# Patient Record
Sex: Female | Born: 1965 | Hispanic: No | Marital: Married | Smoking: Never smoker
Health system: Southern US, Community
[De-identification: ages and names within clinical notes are randomized; demographics above are authoritative.]

## PROBLEM LIST (undated history)

## (undated) DIAGNOSIS — G8929 Other chronic pain: Secondary | ICD-10-CM

## (undated) DIAGNOSIS — K219 Gastro-esophageal reflux disease without esophagitis: Secondary | ICD-10-CM

## (undated) HISTORY — DX: Gastro-esophageal reflux disease without esophagitis: K21.9

## (undated) HISTORY — DX: Other chronic pain: G89.29

---
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
## 1997-06-06 ENCOUNTER — Other Ambulatory Visit: Admission: RE | Admit: 1997-06-06 | Discharge: 1997-06-06 | Payer: Self-pay | Admitting: Gynecology

## 2002-06-06 ENCOUNTER — Encounter: Payer: Self-pay | Admitting: Urology

## 2002-06-06 ENCOUNTER — Observation Stay (HOSPITAL_COMMUNITY): Admission: RE | Admit: 2002-06-06 | Discharge: 2002-06-07 | Payer: Self-pay | Admitting: Obstetrics and Gynecology

## 2002-06-06 ENCOUNTER — Encounter (INDEPENDENT_AMBULATORY_CARE_PROVIDER_SITE_OTHER): Payer: Self-pay

## 2003-01-02 ENCOUNTER — Other Ambulatory Visit: Admission: RE | Admit: 2003-01-02 | Discharge: 2003-01-02 | Payer: Self-pay | Admitting: Obstetrics and Gynecology

## 2004-03-31 ENCOUNTER — Other Ambulatory Visit: Admission: RE | Admit: 2004-03-31 | Discharge: 2004-03-31 | Payer: Self-pay | Admitting: Obstetrics and Gynecology

## 2006-03-09 HISTORY — PX: ABDOMINAL HYSTERECTOMY: SHX81

## 2007-02-23 ENCOUNTER — Inpatient Hospital Stay (HOSPITAL_COMMUNITY): Admission: RE | Admit: 2007-02-23 | Discharge: 2007-02-25 | Payer: Self-pay | Admitting: Obstetrics and Gynecology

## 2007-02-23 ENCOUNTER — Encounter (INDEPENDENT_AMBULATORY_CARE_PROVIDER_SITE_OTHER): Payer: Self-pay | Admitting: Obstetrics and Gynecology

## 2007-03-18 ENCOUNTER — Encounter: Admission: RE | Admit: 2007-03-18 | Discharge: 2007-03-18 | Payer: Self-pay | Admitting: Obstetrics and Gynecology

## 2007-09-20 ENCOUNTER — Encounter: Admission: RE | Admit: 2007-09-20 | Discharge: 2007-09-20 | Payer: Self-pay | Admitting: Obstetrics and Gynecology

## 2008-03-26 ENCOUNTER — Encounter: Admission: RE | Admit: 2008-03-26 | Discharge: 2008-03-26 | Payer: Self-pay | Admitting: Obstetrics and Gynecology

## 2008-09-14 ENCOUNTER — Encounter: Admission: RE | Admit: 2008-09-14 | Discharge: 2008-09-14 | Payer: Self-pay | Admitting: Obstetrics and Gynecology

## 2009-04-05 ENCOUNTER — Encounter: Admission: RE | Admit: 2009-04-05 | Discharge: 2009-04-05 | Payer: Self-pay | Admitting: Obstetrics and Gynecology

## 2010-03-31 ENCOUNTER — Encounter: Payer: Self-pay | Admitting: Obstetrics and Gynecology

## 2010-07-22 NOTE — Discharge Summary (Signed)
NAMEJolin, [name]                  ACCOUNT NO.:  [id number]   MEDICAL RECORD NO.:  [id number]          PATIENT TYPE:  INP   LOCATION:  9307                          FACILITY:  [facility]   PHYSICIAN:  Michelle L. Grewal, M.D.DATE OF BIRTH:  [birth year]   DATE OF ADMISSION:  02/23/2007  DATE OF DISCHARGE:  02/25/2007                               DISCHARGE SUMMARY   PREOP DIAGNOSES:  1. Pelvic pain.  2. History of endometriosis.  3. Interstitial cystitis.  4. Enlarged right labia.   POSTOP DIAGNOSES:  1. Pelvic pain.  2. History of endometriosis.  3. Interstitial cystitis.  4. Enlarged right labia.   PROCEDURE:  The patient is a 45 year old G1, P1, who presents today for  definitive treatment of her endometriosis and pelvic pain.  She  underwent LAVH, BSO, fulguration of endometriosis, and right labial  reduction by myself.  Dr. Logan Bores performed cystoscopy and  hydrodistention.  EBL was 200 mL.  She did very well after surgery.  She  did feel somewhat distended on postop day #1 and felt some gas pain.  Her hemoglobin was 9.  She was discharged home in good condition on  postop day #2.  She had flatus on postop day #2.  Her abdomen was soft  and nontender.  She was given ibuprofen 600 mg every 6 hours to take as  needed for pain, Tylox one to two every 4-6 as needed for pain.  She  will follow up in the office in 2-3 weeks.  She was advised to call if  she has any heavy vaginal bleeding, fever, drainage from her vagina,  nausea, vomiting, increasing abdominal pain.  She was advised no driving  for 1-2 weeks, no sex for 6 weeks.      Michelle L. Vincente Poli, M.D.  Electronically Signed     MLG/MEDQ  D:  03/14/2007  T:  03/15/2007  Job:  [id number]

## 2010-07-22 NOTE — Op Note (Signed)
NAMEChaylee, [name]                  ACCOUNT NO.:  [id number]   MEDICAL RECORD NO.:  [id number]          PATIENT TYPE:  AMB   LOCATION:  SDC                           FACILITY:  [facility]   PHYSICIAN:  Michelle L. Grewal, M.D.DATE OF BIRTH:  09/26/1965   DATE OF PROCEDURE:  02/23/2007  DATE OF DISCHARGE:                               OPERATIVE REPORT   PREOPERATIVE DIAGNOSES:  Pelvic pain and endometriosis and enlarged  right labia.   POSTOPERATIVE DIAGNOSES:  Pelvic pain and endometriosis and enlarged  right labia.   PROCEDURE:  Laparoscopic-assisted vaginal hysterectomy, bilateral  salpingo-oophorectomy, fulguration of endometriosis and right labial  reduction.   SURGEON:  Michelle L. Vincente Poli, M.D.   ASSISTANTFreddy Finner, M.D.   ANESTHESIA:  General.   SPECIMENS:  Uterus, cervix, tubes and ovaries.   ESTIMATED BLOOD LOSS:  200 mL.   COMPLICATIONS:  None.   PROCEDURE:  Patient taken to the operating room.  She was intubated.  She was prepped and draped in usual sterile fashion.  She was initially  taken to OR by Dr. Logan Bores to perform a cystoscopy and hydrodistention,  that will be dictated by him.  After he completed the procedure I then  reprepped and draped the patient for LAVH.  The uterine manipulator was  inserted.  A Foley catheter was inserted.  Attention was turned to the  abdomen.  Infraumbilical incision was made after local was infiltrated.  The Veress needle was inserted.  Pneumoperitoneum was performed and an  11-mm trocar was inserted.  The patient was placed in Trendelenburg  position.  A 5-mm trocar was inserted suprapubically.  We then  visualized the pelvis.  The uterus appeared normal.  She had two powder  burn lesions, one on the right pelvic sidewall, one on the left pelvic  sidewall and we visualized the ureters that were peristalsing normally.  I then identified each IP ligament.  Started on the right side, burned  the IP ligament just beneath the  ovary and carried it down to the round  ligament.  This was done on the right and then on the left side in  similar fashion.  I did use Kleppingers to burn and fulgurate the  lesions on the side wall.  We then turned our attention vaginally where  then a weighted speculum was placed in the vagina.  A circumferential  incision was made around the cervix.  The posterior cul-de-sac was  entered sharply.  The anterior cul-de-sac was entered sharply.  Curved  Haney clamps were then placed to clamp the uterosacral and cardinal  ligaments on either side.  Each pedicle was cut and suture ligated using  zero Vicryl suture.  We walked our way up the broad ligament on either  side.  Each pedicle was suture ligated using zero Vicryl suture.  The  specimen was removed.  The pedicles were secured using zero Vicryl  suture.  The posterior cuff was closed from 3 to 9 o'clock in a running  locked stitch using zero Vicryl suture.  The cuff was closed completely  anterior  to posterior in a running locked stitch using zero Vicryl  suture.  Hemostasis was excellent throughout the case.  Then at this  point, she had some discomfort from an enlarged right labia.  I then  used the scalpel and made a linear incision and basically trimmed off  the redundant tissue so that the right labial was identical size as the  left labium.  We then closed that using the smallest 2-0 Vicryl running  stitch.  At this point I then changed my gloves and went back up to the  abdomen, replaced the pneumoperitoneum, irrigated the pelvis completely.  Hemostasis was excellent.  No bleeding was noted from any surgical  pedicles.  All of the pneumoperitoneum was released.  The trocars were  removed and Dermabond was placed at each incision site.  All sponge, lap  and instrument counts were correct x2.  She went to recovery room in  stable condition.      Michelle L. Vincente Poli, M.D.  Electronically Signed     MLG/MEDQ  D:   02/23/2007  T:  02/23/2007  Job:  [id number]
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
## 2010-07-22 NOTE — Op Note (Signed)
[name], [name]                  ACCOUNT NO.:  [id number]   MEDICAL RECORD NO.:  [id number]          PATIENT TYPE:  AMB   LOCATION:  SDC                           [facility]   PHYSICIAN:  Jamison Neighbor, M.D.  DATE OF BIRTH:  16-Aug-1965   DATE OF PROCEDURE:  02/22/2007  DATE OF DISCHARGE:                               OPERATIVE REPORT   PREOPERATIVE DIAGNOSES:  1. Chronic pelvic pain, rule out interstitial cystitis.  2. Chronic pelvic pain with history of endometriosis.   POSTOPERATIVE DIAGNOSES:  1. Chronic pelvic pain, rule out interstitial cystitis.  2. Chronic pelvic pain with history of endometriosis.   PROCEDURES:  1. Cystoscopy, hydrodistention of the bladder, Marcaine and Pyridium      instillation, Marcaine and Kenalog injection, Jamison Neighbor, M.D.  2. Laparoscopically-assisted vaginal hysterectomy with bilateral      salpingo-oophorectomy and labioplasty by Marcelino Duster L. Vincente Poli, M.D.   ANESTHESIA:  General.   COMPLICATIONS FROM UROLOGICAL SURGERY:  None.   DRAINS:  A Postoperative Foley catheter.   HISTORY:  This 45 year old female has chronic pelvic pain with a history  of endometriosis.  She is scheduled to undergo LAVH, BSO and  labioplasty.  The patient has concern that she might have interstitial  cystitis and has requested a cystoscopy and hydrodistention be  performed.  This is an attempt to try and perform this at the same time  as her planned gynecologic procedure.  She understands the risks and  benefits of the procedure and specifically realizes that the procedure  itself is being done for diagnostic purposes and may not given any  significant information insofar as pain control as concerned.  Full  informed consent was obtained.   PROCEDURE:  After successful induction of general anesthesia, the  patient was placed in the dorsal lithotomy position, prepped with  Betadine and draped in the usual sterile fashion.  Careful bimanual  examination  revealed no significant cystocele, rectocele or enterocele.  Vault length was fairly normal.  The urethra was palpably normal with no  signs of a diverticulum.  The cystoscope was inserted and the bladder  was carefully inspected.  No tumors or stones could be seen.  There was  a modest degree of squamous metaplasia at the base of bladder felt to be  insignificant and not a likely source for any pain.  There was nothing  that required a biopsy or fulguration.  Hydrodistention of the bladder  was performed.  The bladder was distended at a pressure of 100 cmH2O for  5 minutes.  When the bladder was drained bladder capacity was just under  1000 mL, which is almost normal with normal being 1150 and the average  capacity for an IC patient being approximately 575.  The patient had  little, if any, in the way of glomerulations.  There was nothing  requiring biopsy.  This was felt to be in essence a normal bladder.  The  bladder was drained.  A mixture of Marcaine and Pyridium was left in the  bladder.  Marcaine and Kenalog were injected as  a pudendal block.  The  patient tolerated the procedure and was taken to the recovery room in  good condition.  She had a Foley catheter placed but clamped.  It will  be used by Dr. Vincente Poli for her portion of the procedure.  This will be  dictated by Dr. Vincente Poli well separately.  The patient will be admitted to  the hospital under Dr. Lynnell Dike service.  I would be happy to see her in  follow-up in approximately 2 weeks.  At that point we will review these  findings with the patient and determine if she had any change in her  symptoms.  If the patient had significant improvement in her voiding  symptoms simply from the hydrodistention, then I would consider that  indicative of interstitial cystitis and might add consider treatment  with instillation therapy or an [place]-based protocol.  On the other  hand, if there was no change in symptoms and she simply had had  the same  urgency and frequency, we would do empiric therapy with  anticholinergics, InterStim or Botox injections.  Decision as to the  further management of her urinary symptoms will be based on her next  follow-up visit.      Jamison Neighbor, M.D.  Electronically Signed     RJE/MEDQ  D:  02/23/2007  T:  02/23/2007  Job:  [id number]   cc:   Marcelino Duster L. Vincente Poli, M.D.  Fax: [phone]

## 2010-07-25 NOTE — Op Note (Signed)
NAMEKRISTAIN, [name]                            ACCOUNT NO.:  [id number]   MEDICAL RECORD NO.:  [id number]                   PATIENT TYPE:  AMB   LOCATION:  DAY                                  FACILITY:  [facility]   PHYSICIAN:  Jamison Neighbor, M.D.               DATE OF BIRTH:  Nov 18, 1965   DATE OF PROCEDURE:  06/06/2002  DATE OF DISCHARGE:                                 OPERATIVE REPORT   PREOPERATIVE DIAGNOSIS:  1. Microscopic hematuria.  2. Persistent lower urinary tract symptomatology consistent with possible     interstitial cystitis.  3. History of endometriosis.   POSTOPERATIVE DIAGNOSIS:  1. Microscopic hematuria.  2. Persistent lower urinary tract symptomatology consistent with possible     interstitial cystitis.  3. History of endometriosis.   OPERATION PERFORMED:  Cystoscopy, urethral calibration, bilateral  retrogrades including interpretation, hydrodistention of the bladder,  bladder biopsy, Marcaine Pyridium instillation, Marcaine and Kenalog  injection.   SURGEON:  Jamison Neighbor, M.D.   ANESTHESIA:  General.   COMPLICATIONS:  None.   DRAINS:  None.   INDICATIONS FOR PROCEDURE:  This 45 year old female has had problems with  lower urinary tract symptoms including urgency, frequency and chronic pelvic  pain.  The patient is scheduled to undergo evaluation by Dr. Vincente Poli which  will include laparoscopy.  Dr. Vincente Poli felt that the patient had all the  signs and symptoms of interstitial cystitis based on her evaluation.  The  patient was also evaluated in my office and we felt that not only her exam  but her symptom score was consistent with an interstitial cystitis  diagnosis.  The patient has requested a hydrodistention be done at the time  of the laparoscopy.  Because the patient also has a history of microscopic  hematuria, she will undergo a retrograde study simultaneously.  Full and  informed consent was obtained.   DESCRIPTION OF PROCEDURE:  After  successful induction of general anesthesia,  the patient was placed and positioned for laparoscopy.  She underwent  laparoscopic evaluation by Dr. Vincente Poli.  A small powder burn lesion was seen  on the bladder flap but there was not widespread endometriosis as source for  the pelvic pain.  This area was fulgurated.  The patient was then prepared  for cystoscopy.  Careful bimanual examination revealed no significant  abnormalities, specifically, no masses were detected.  Urethra was  unremarkable.  There was no evidence of cysto, recto or enterocele.  The  uterus was palpably normal.  The urethra was calibrated at 65 [place] with  female urethral sounds with no signs of stenosis or stricture.  The  cystoscope was inserted.  The bladder was carefully inspected.  It was free  of any tumors or stones.  Both ureteral orifices were normal in  configuration and location.  Clear urine seemed to efflux from each.  Retrograde studies performed on each side  showed no upper tract  abnormalities and the drain out film showed no signs of obstruction.  Hydrodistention was then performed.  The bladder was distended at a pressure  of 100 cm of H2O for five minutes, then the bladder was drained.  A moderate  sum of glomerulations could be seen.  The bladder capacity was slightly  diminished at 800 compared to a normal capacity of 1150 and average IC  capacity of 575. Additional inspection was performed and there was no  evidence of endometrial implants anywhere in the bladder or any other  significant abnormality.  The bladder was drained.  A bladder biopsy was  performed.  The biopsy site was cauterized.  This will be sent for mast cell  analysis as well as to rule out carcinoma in situ.  A mixture of Marcaine  and Pyridium was instilled in the bladder.  Marcaine and Kenalog were  injected as a pudendal block.  The patient tolerated the procedure well and  was taken to recovery in good condition.                                                Jamison Neighbor, M.D.    RJE/MEDQ  D:  06/06/2002  T:  06/06/2002  Job:  [id number]   cc:   [id number]  [place]  [place] [address]  Fax: [phone]   Stann Mainland. Vincente Poli, M.D.  [address], [address] [place]  [place] [address]  Fax: [phone]

## 2010-07-25 NOTE — Op Note (Signed)
   [name], [name]                            ACCOUNT NO.:  [id number]   MEDICAL RECORD NO.:  [id number]                   PATIENT TYPE:  AMB   LOCATION:  DAY                                  [facility]ACILITY:  [facility]   PHYSICIAN:  Michelle L. Vincente Poli, M.D.            DATE OF BIRTH:  1966-02-20   DATE OF PROCEDURE:  06/06/2002  DATE OF DISCHARGE:                                 OPERATIVE REPORT   PREOPERATIVE DIAGNOSIS:  Pelvic pain.   POSTOPERATIVE DIAGNOSES:  1. Pelvic pain.  2. Minimal endometriosis involving bladder flap and cul-de-sac.   PROCEDURE:  Diagnostic laparoscopy and fulguration of endometriosis.   SURGEON:  Michelle L. Vincente Poli, M.D.   ANESTHESIA:  General.   FINDINGS:  Minimal endometriosis on bladder flap and one powder burn lesion  in the cul-de-sac.   PROCEDURE:  The patient was taken to the operating room.  She was intubated  without difficulty and placed in the low lithotomy position.  The abdomen,  vagina, and vulva were prepped and draped in the usual sterile fashion.  An  in-and-out catheter was used to empty the bladder.  Using a scalpel a small  infraumbilical incision was made.  The Veress needle was inserted without  difficulty.  Pneumoperitoneum was initiated.  The Veress needle was removed  and the 10-11 mm trocar was placed through the same incision and a  laparoscope was immediately introduced into the abdominal cavity.  The upper  abdomen appeared very normal.  There were no adhesions noted.  The patient  was placed in Trendelenburg position and a secondary 5 mm trocar was placed  suprapubically under direct visualization.  The inspection of the pelvis  revealed that there were no adhesions.  The adnexa appeared within normal  limits.  The tubes appeared normal, uterus appeared normal.  There were two  small powder burn lesions involving the bladder flap on the right side that  were fulgurated using Kleppingers and there was one small powder burn  lesion  consistent with endometriosis in the cul-de-sac.  This was fulgurated as  well.  At the end of the procedure pneumoperitoneum was released.  All  instruments were removed from the abdomen cavity.  The incisions were closed  using interrupted 3-0 Vicryl suture.  Steri-Strips were applied and bandages  were applied to the incisions.  At this point Dr. Logan Bores scrubbed in and  completed the surgery, and that portion of the surgery will be dictated by  Dr. Logan Bores.                                               Michelle L. Vincente Poli, M.D.    Florestine Avers  D:  06/06/2002  T:  06/06/2002  Job:  [id number]

## 2010-12-12 LAB — CBC
HCT: 35.8 — ABNORMAL LOW
Hemoglobin: 9.1 — ABNORMAL LOW
MCHC: 34.2
MCHC: 35
MCV: 89
Platelets: 325
Platelets: 335
RDW: 12.8
WBC: 6.3

## 2010-12-12 LAB — PREGNANCY, URINE: Preg Test, Ur: NEGATIVE

## 2011-07-16 ENCOUNTER — Other Ambulatory Visit: Payer: Self-pay | Admitting: Obstetrics and Gynecology

## 2013-06-20 ENCOUNTER — Ambulatory Visit
Admission: RE | Admit: 2013-06-20 | Discharge: 2013-06-20 | Disposition: A | Discharge status: Home / Self Care | Payer: BC Managed Care – PPO | Source: Ambulatory Visit | Attending: Legal Medicine | Admitting: Legal Medicine

## 2013-06-20 ENCOUNTER — Other Ambulatory Visit: Payer: Self-pay | Admitting: Family Medicine

## 2013-06-20 ENCOUNTER — Other Ambulatory Visit: Payer: Self-pay | Admitting: Legal Medicine

## 2013-06-20 DIAGNOSIS — R1011 Right upper quadrant pain: Secondary | ICD-10-CM

## 2013-07-04 ENCOUNTER — Other Ambulatory Visit: Payer: Self-pay | Admitting: Legal Medicine

## 2013-07-04 DIAGNOSIS — Z1211 Encounter for screening for malignant neoplasm of colon: Secondary | ICD-10-CM

## 2013-08-21 ENCOUNTER — Other Ambulatory Visit: Payer: BC Managed Care – PPO

## 2013-08-30 ENCOUNTER — Other Ambulatory Visit: Payer: BC Managed Care – PPO

## 2013-09-05 ENCOUNTER — Other Ambulatory Visit: Payer: Self-pay | Admitting: Legal Medicine

## 2013-09-06 ENCOUNTER — Ambulatory Visit
Admission: RE | Admit: 2013-09-06 | Discharge: 2013-09-06 | Disposition: A | Discharge status: Home / Self Care | Payer: BC Managed Care – PPO | Source: Ambulatory Visit | Attending: Legal Medicine | Admitting: Legal Medicine

## 2013-09-06 DIAGNOSIS — Z1211 Encounter for screening for malignant neoplasm of colon: Secondary | ICD-10-CM

## 2014-08-21 ENCOUNTER — Other Ambulatory Visit: Payer: Self-pay

## 2014-08-21 DIAGNOSIS — Z1231 Encounter for screening mammogram for malignant neoplasm of breast: Secondary | ICD-10-CM

## 2014-10-04 ENCOUNTER — Ambulatory Visit
Admission: RE | Admit: 2014-10-04 | Discharge: 2014-10-04 | Disposition: A | Discharge status: Home / Self Care | Payer: BLUE CROSS/BLUE SHIELD | Source: Ambulatory Visit

## 2014-10-04 DIAGNOSIS — Z1231 Encounter for screening mammogram for malignant neoplasm of breast: Secondary | ICD-10-CM

## 2015-09-15 IMAGING — US US ABDOMEN LIMITED
1 series · 14 of 25 positions shown · non-contrast
Comparison: None.

CLINICAL DATA: Right upper quadrant abdominal pain.

EXAM:
US ABDOMEN LIMITED - RIGHT UPPER QUADRANT

[Series 1: us abdomen limited · 0.20mm/px · 14 of 46 slices shown]
[im 1/46]
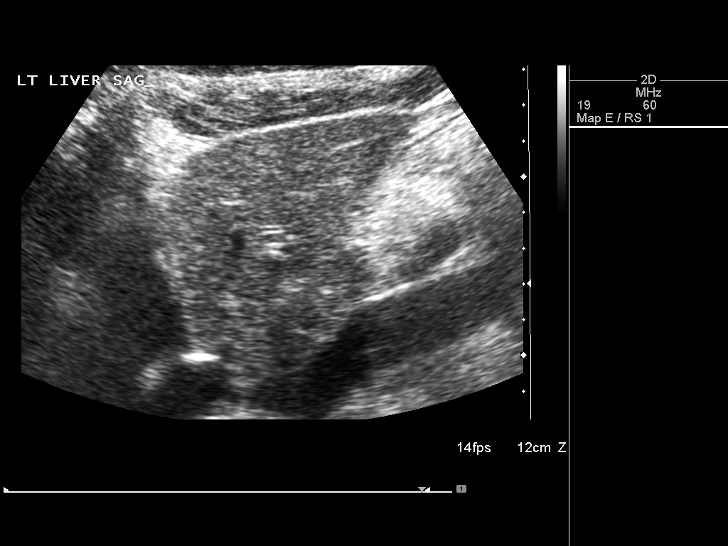
[im 4/46]
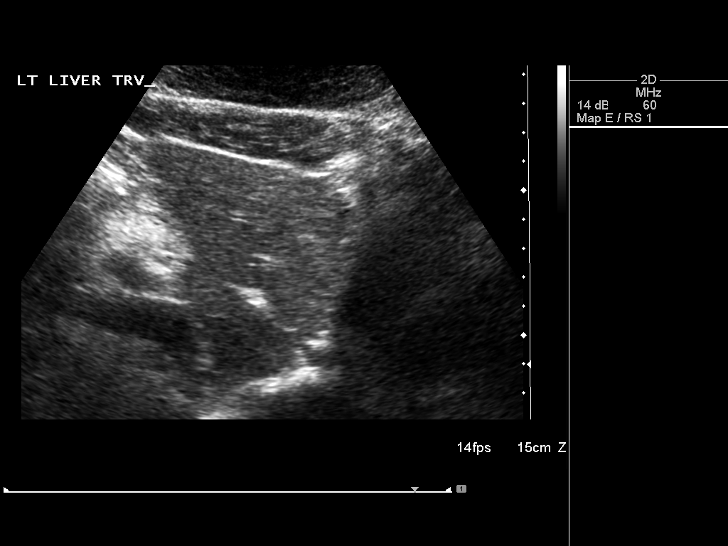
[im 8/46]
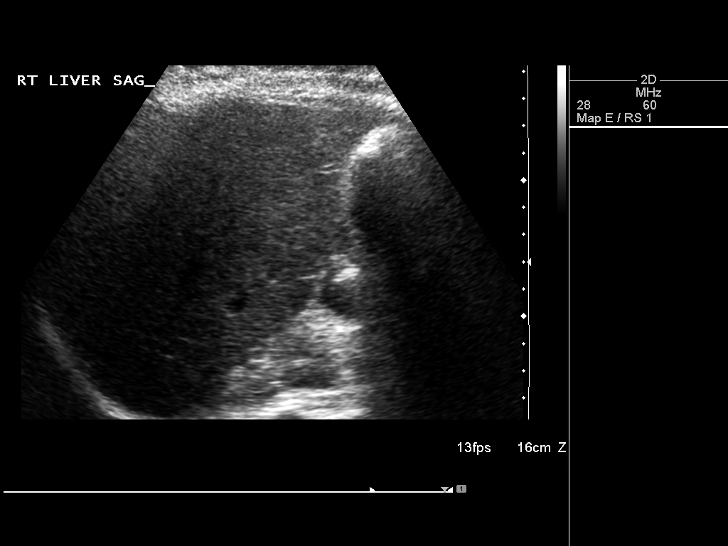
[im 12/46]
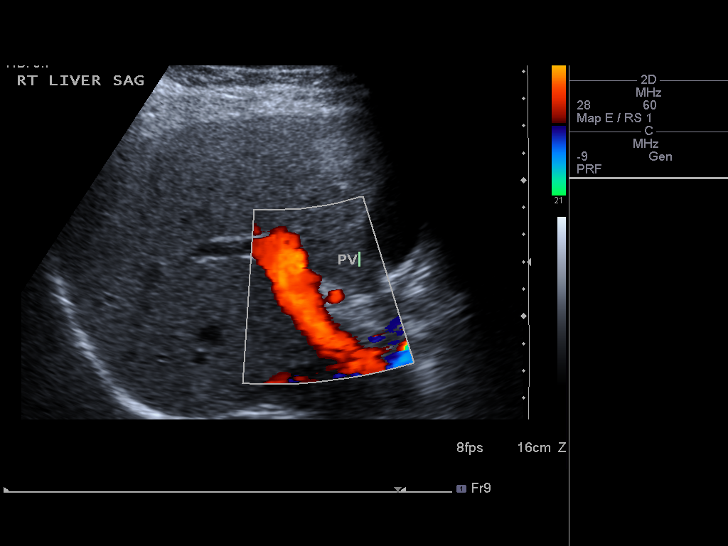
[im 16/46]
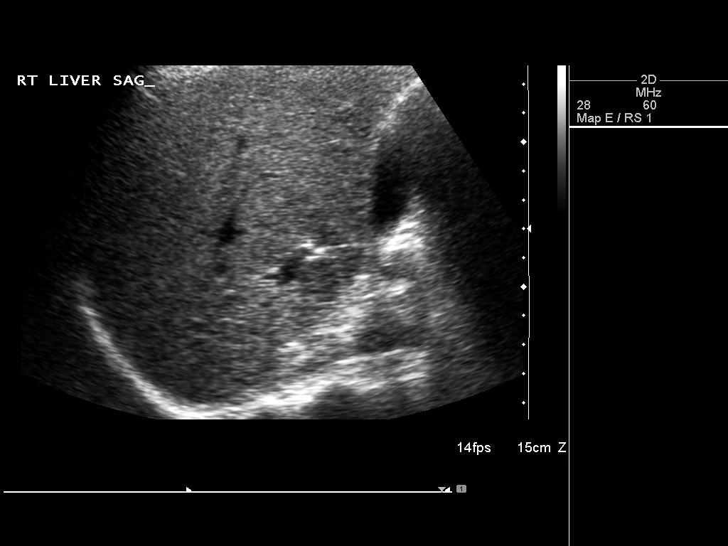
[im 17/46]
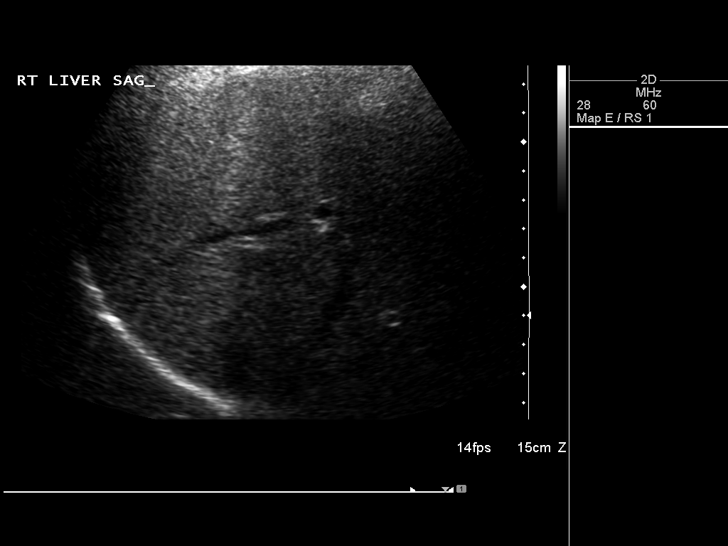
[im 21/46]
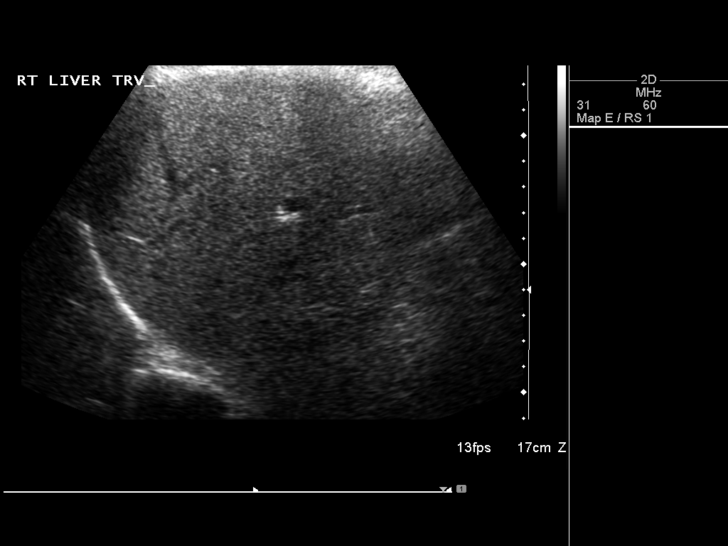
[im 25/46]
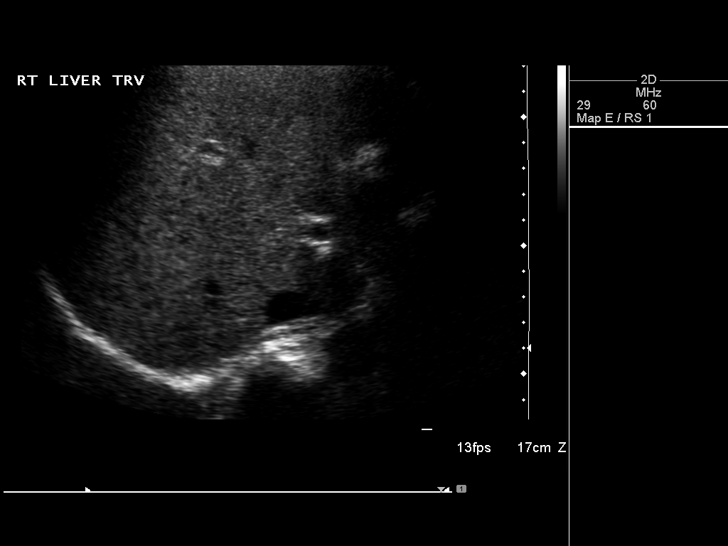
[im 29/46]
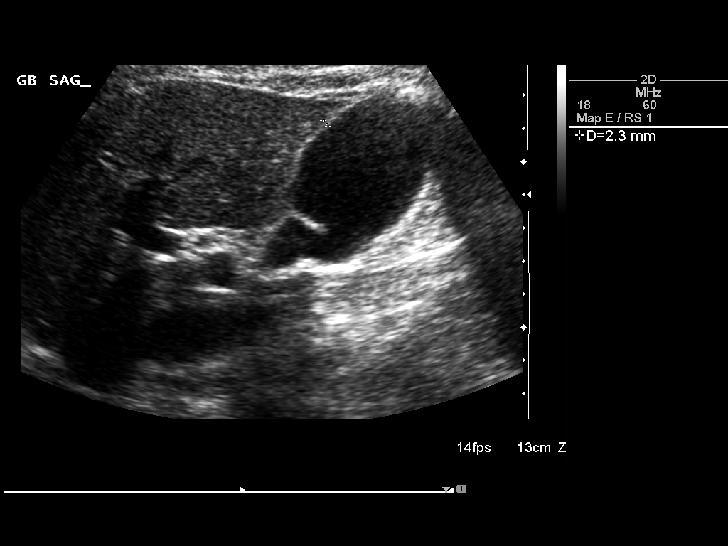
[im 31/46]
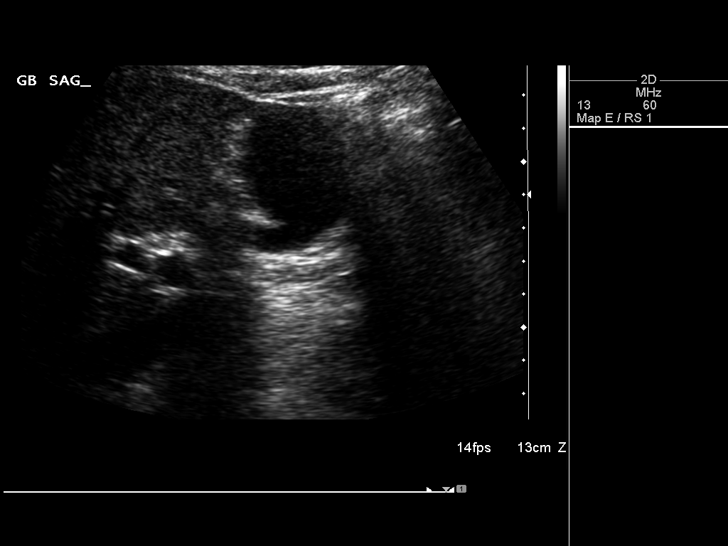
[im 34/46]
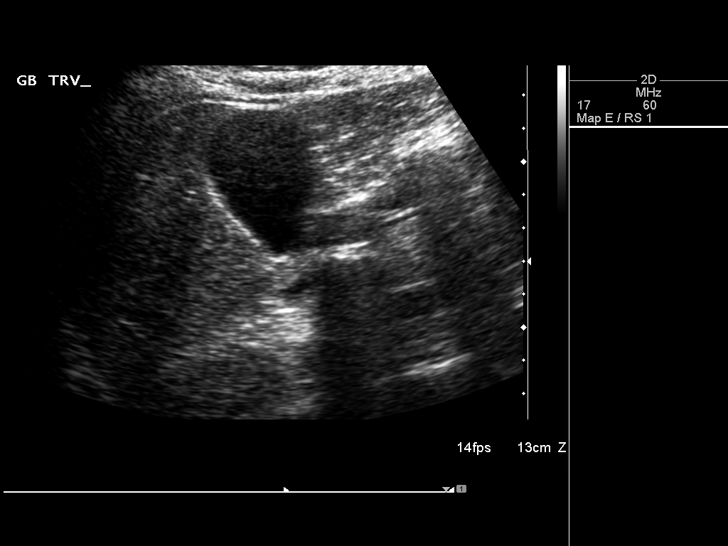
[im 38/46]
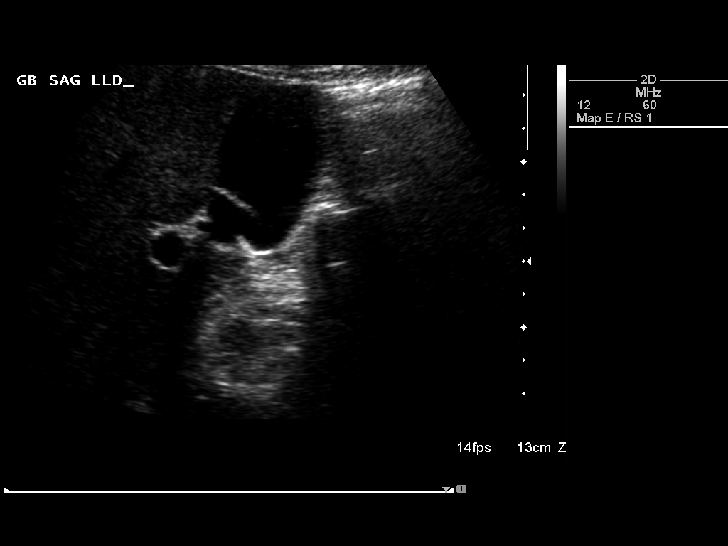
[im 42/46]
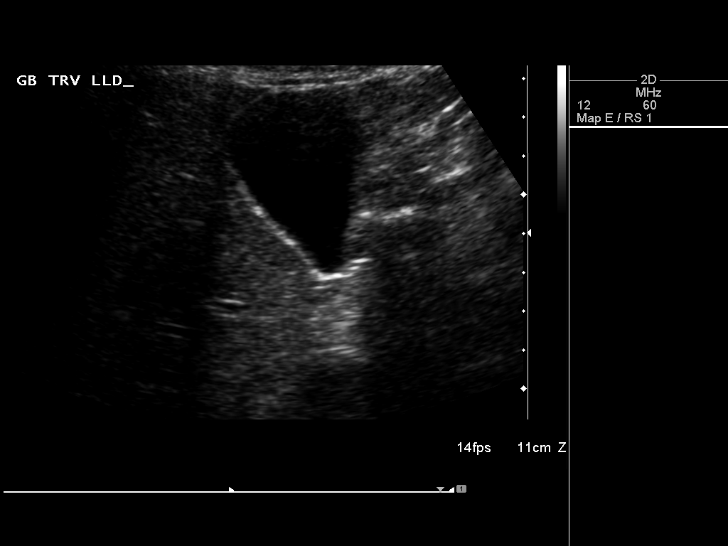
[im 46/46]
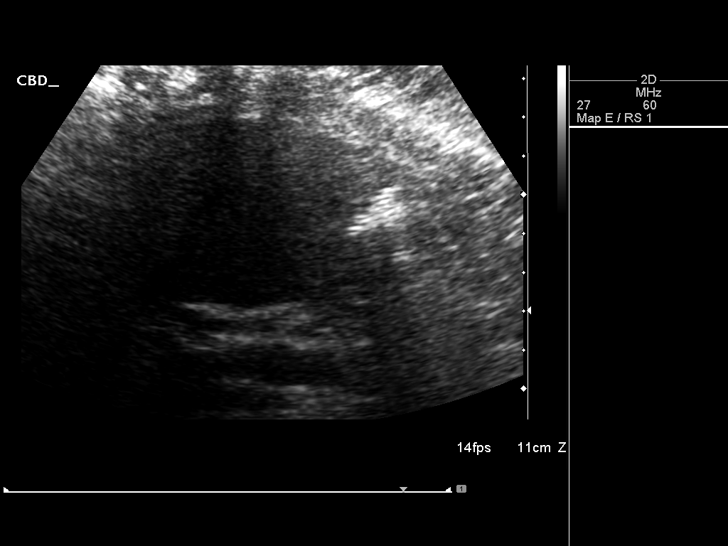

[14 of 25 positions shown; findings below may reference images not displayed]

FINDINGS: Gallbladder:

No gallstones or wall thickening visualized. No sonographic Murphy
sign noted.

Common bile duct:

Diameter: 4.5 mm

Liver:

Normal echogenicity without focal lesion or biliary dilatation.
IMPRESSION: Normal right upper quadrant ultrasound examination.

## 2022-03-26 ENCOUNTER — Encounter: Payer: Self-pay | Admitting: Nurse Practitioner

## 2022-03-26 ENCOUNTER — Ambulatory Visit: Payer: BC Managed Care – PPO | Admitting: Nurse Practitioner

## 2022-03-26 VITALS — BP 112/70 | HR 90 | Temp 97.5°F | Ht 63.0 in | Wt 188.4 lb

## 2022-03-26 DIAGNOSIS — Z6833 Body mass index (BMI) 33.0-33.9, adult: Secondary | ICD-10-CM

## 2022-03-26 DIAGNOSIS — Z0000 Encounter for general adult medical examination without abnormal findings: Secondary | ICD-10-CM | POA: Insufficient documentation

## 2022-03-26 DIAGNOSIS — K219 Gastro-esophageal reflux disease without esophagitis: Secondary | ICD-10-CM

## 2022-03-26 DIAGNOSIS — B009 Herpesviral infection, unspecified: Secondary | ICD-10-CM | POA: Insufficient documentation

## 2022-03-26 DIAGNOSIS — E6609 Other obesity due to excess calories: Secondary | ICD-10-CM | POA: Insufficient documentation

## 2022-03-26 DIAGNOSIS — R300 Dysuria: Secondary | ICD-10-CM | POA: Diagnosis not present

## 2022-03-26 LAB — POCT URINALYSIS DIP (CLINITEK)
Bilirubin, UA: NEGATIVE
Glucose, UA: NEGATIVE mg/dL
Ketones, POC UA: NEGATIVE mg/dL
Leukocytes, UA: NEGATIVE
Nitrite, UA: NEGATIVE
POC PROTEIN,UA: NEGATIVE
Spec Grav, UA: 1.025 (ref 1.010–1.025)
Urobilinogen, UA: 0.2 E.U./dL — AB
pH, UA: 5 (ref 5.0–8.0)

## 2022-03-26 MED ORDER — VALACYCLOVIR HCL 1 G PO TABS: 1000.0000 mg | ORAL_TABLET | ORAL | 0 refills | Status: AC | PRN

## 2022-03-26 NOTE — Assessment & Plan Note (Addendum)
Patient is intersted to use Zepbound  CBC, CMP, hemoglobin A1C and Lipid profile  Lifestyle modification discussed today.  Nutrition: Stressed importance of moderation in sodium intake, saturated fat and cholesterol, caloric balance, sufficient intake of complex carbohydrates, fiber, calcium and iron.   Exercise: Stressed the importance of regular exercise.

## 2022-03-26 NOTE — Patient Instructions (Addendum)
Follow up in 6 months   Preventive Care 12-59 Years Old, Female Preventive care refers to lifestyle choices and visits with your health care provider that can promote health and wellness. Preventive care visits are also called wellness exams. What can I expect for my preventive care visit? Counseling Your health care provider may ask you questions about your: Medical history, including: Past medical problems. Family medical history. Pregnancy history. Current health, including: Menstrual cycle. Method of birth control. Emotional well-being. Home life and relationship well-being. Sexual activity and sexual health. Lifestyle, including: Alcohol, nicotine or tobacco, and drug use. Access to firearms. Diet, exercise, and sleep habits. Work and work Statistician. Sunscreen use. Safety issues such as seatbelt and bike helmet use. Physical exam Your health care provider will check your: Height and weight. These may be used to calculate your BMI (body mass index). BMI is a measurement that tells if you are at a healthy weight. Waist circumference. This measures the distance around your waistline. This measurement also tells if you are at a healthy weight and may help predict your risk of certain diseases, such as type 2 diabetes and high blood pressure. Heart rate and blood pressure. Body temperature. Skin for abnormal spots. What immunizations do I need?  Vaccines are usually given at various ages, according to a schedule. Your health care provider will recommend vaccines for you based on your age, medical history, and lifestyle or other factors, such as travel or where you work. What tests do I need? Screening Your health care provider may recommend screening tests for certain conditions. This may include: Lipid and cholesterol levels. Diabetes screening. This is done by checking your blood sugar (glucose) after you have not eaten for a while (fasting). Pelvic exam and Pap  test. Hepatitis B test. Hepatitis C test. HIV (human immunodeficiency virus) test. STI (sexually transmitted infection) testing, if you are at risk. Lung cancer screening. Colorectal cancer screening. Mammogram. Talk with your health care provider about when you should start having regular mammograms. This may depend on whether you have a family history of breast cancer. BRCA-related cancer screening. This may be done if you have a family history of breast, ovarian, tubal, or peritoneal cancers. Bone density scan. This is done to screen for osteoporosis. Talk with your health care provider about your test results, treatment options, and if necessary, the need for more tests. Follow these instructions at home: Eating and drinking  Eat a diet that includes fresh fruits and vegetables, whole grains, lean protein, and low-fat dairy products. Take vitamin and mineral supplements as recommended by your health care provider. Do not drink alcohol if: Your health care provider tells you not to drink. You are pregnant, may be pregnant, or are planning to become pregnant. If you drink alcohol: Limit how much you have to 0-1 drink a day. Know how much alcohol is in your drink. In the [place] one drink equals one 12 oz bottle of beer (355 mL), one 5 oz glass of wine (148 mL), or one 1 oz glass of hard liquor (44 mL). Lifestyle Brush your teeth every morning and night with fluoride toothpaste. Floss one time each day. Exercise for at least 30 minutes 5 or more days each week. Do not use any products that contain nicotine or tobacco. These products include cigarettes, chewing tobacco, and vaping devices, such as e-cigarettes. If you need help quitting, ask your health care provider. Do not use drugs. If you are sexually active, practice safe sex. Use a  condom or other form of protection to prevent STIs. If you do not wish to become pregnant, use a form of birth control. If you plan to become pregnant,  see your health care provider for a prepregnancy visit. Take aspirin only as told by your health care provider. Make sure that you understand how much to take and what form to take. Work with your health care provider to find out whether it is safe and beneficial for you to take aspirin daily. Find healthy ways to manage stress, such as: Meditation, yoga, or listening to music. Journaling. Talking to a trusted person. Spending time with friends and family. Minimize exposure to UV radiation to reduce your risk of skin cancer. Safety Always wear your seat belt while driving or riding in a vehicle. Do not drive: If you have been drinking alcohol. Do not ride with someone who has been drinking. When you are tired or distracted. While texting. If you have been using any mind-altering substances or drugs. Wear a helmet and other protective equipment during sports activities. If you have firearms in your house, make sure you follow all gun safety procedures. Seek help if you have been physically or sexually abused. What's next? Visit your health care provider once a year for an annual wellness visit. Ask your health care provider how often you should have your eyes and teeth checked. Stay up to date on all vaccines. This information is not intended to replace advice given to you by your health care provider. Make sure you discuss any questions you have with your health care provider. Document Revised: 08/21/2020 Document Reviewed: 08/21/2020 Elsevier Patient Education  [place].

## 2022-03-26 NOTE — Progress Notes (Signed)
Subjective:  Patient ID: [name], female    DOB: 1965-12-23  Age: 57 y.o. MRN: [id number]  Chief Complaints: Physical exam  History of Present illness:  Patient is in the clinic for establishment of care and physical examination. According to her she used to go to [employer] [facility] and her NP retired and she needed to find someone else. She has a h/o hysterectomy, no any recent complaints just to establish care and physical exam but she is interested in weight loss program and she wants to use Zepbound. Her husband used to the patient to Dr Marina Goodell and he got Ozempic for weight loss and it worked for her husband and she said she wants to use either one but her first interest is Zepbound. She has a stressful job and she does not have a time for regular excericse but she tried to eat healthy.  Well Adult Physical: Patient here for a comprehensive physical exam.The patient reports no problems Do you take any herbs or supplements that were not prescribed by a doctor? yes Are you taking calcium supplements? yes Are you taking aspirin daily? no  Encounter for general adult medical examination without abnormal findings  Physical ("At Risk" items are starred): Patient's last physical exam was 1 year ago .  Patient is not afflicted from Stress Incontinence and Urge Incontinence  Patient wears a seat belt, has smoke detectors, has carbon monoxide detectors, practices appropriate gun safety, and wears sunscreen with extended sun exposure. Dental Care: biannual cleanings, brushes and flosses daily. Ophthalmology/Optometry: Annual visit.  Hearing loss: none Vision impairments: none  Menarche: 11 Menstrual History: in 2008 Pregnancy history: 1 living birth Safe at home: living with Husband Self breast exams: yes   Flowsheet Row Office Visit from 03/26/2022 in [facility]  PHQ-2 Total Score 0           No smoke at all   No alcohol    Social Hx   Social History   Socioeconomic  History   Marital status: Married    Spouse name: Not on file   Number of children: Not on file   Years of education: Not on file   Highest education level: Not on file  Occupational History   Not on file  Tobacco Use   Smoking status: Never   Smokeless tobacco: Never  Substance and Sexual Activity   Alcohol use: Never   Drug use: Never   Sexual activity: Not on file  Other Topics Concern   Not on file  Social History Narrative   Not on file   Social Determinants of Health   Financial Resource Strain: Low Risk  (03/26/2022)   Overall Financial Resource Strain (CARDIA)    Difficulty of Paying Living Expenses: Not hard at all  Food Insecurity: No Food Insecurity (03/26/2022)   Hunger Vital Sign    Worried About Running Out of Food in the Last Year: Never true    Ran Out of Food in the Last Year: Never true  Transportation Needs: No Transportation Needs (03/26/2022)   PRAPARE - Administrator, Civil Service (Medical): No    Lack of Transportation (Non-Medical): No  Physical Activity: Inactive (03/26/2022)   Exercise Vital Sign    Days of Exercise per Week: 0 days    Minutes of Exercise per Session: 0 min  Stress: Stress Concern Present (03/26/2022)   [employer] of Occupational Health - Occupational Stress Questionnaire    Feeling of Stress : To  some extent  Social Connections: Not on file   Past Medical History:  Diagnosis Date   Chronic pain    GERD (gastroesophageal reflux disease)    Past Surgical History:  Procedure Laterality Date   ABDOMINAL HYSTERECTOMY  2008   DENTAL SURGERY  1980    Family History  Problem Relation Age of Onset   Hyperlipidemia Mother    Hypertension Mother    Diabetes Mother    Hyperlipidemia Father    Hypertension Father    Diabetes Father     Review of Systems  Constitutional:  Negative for appetite change, fatigue, fever and unexpected weight change.  HENT:  Negative for congestion, ear pain, hearing loss, sinus  pain and tinnitus.   Eyes:  Negative for photophobia, pain and visual disturbance.  Respiratory:  Negative for chest tightness, shortness of breath and wheezing.   Cardiovascular:  Negative for chest pain, palpitations and leg swelling.  Gastrointestinal:  Negative for abdominal pain, constipation, diarrhea, nausea and vomiting.  Endocrine: Negative for cold intolerance, heat intolerance, polydipsia, polyphagia and polyuria.  Genitourinary:  Negative for dysuria, frequency and urgency.  Musculoskeletal:  Negative for arthralgias, back pain, joint swelling and myalgias.  Skin:  Negative for color change.  Neurological:  Negative for dizziness, weakness and headaches.  Hematological:  Does not bruise/bleed easily.  Psychiatric/Behavioral:  Negative for sleep disturbance and suicidal ideas. The patient is not nervous/anxious.      Objective:  BP 112/70 (BP Location: Left Arm, Patient Position: Sitting, Cuff Size: Large)   Pulse 90   Temp (!) 97.5 F (36.4 C) (Temporal)   Ht 5\' 3"  (1.6 m)   Wt 188 lb 6.4 oz (85.5 kg)   SpO2 97%   BMI 33.37 kg/m      03/26/2022    8:13 AM  BP/Weight  Systolic BP 696  Diastolic BP 70  Wt. (Lbs) 188.4  BMI 33.37 kg/m2    Physical Exam Vitals reviewed.  Constitutional:      Appearance: Normal appearance.  Neck:     Vascular: No carotid bruit.  Cardiovascular:     Rate and Rhythm: Normal rate and regular rhythm.     Heart sounds: Normal heart sounds.  Pulmonary:     Effort: Pulmonary effort is normal.     Breath sounds: Normal breath sounds.  Abdominal:     General: Bowel sounds are normal.     Palpations: Abdomen is soft.     Tenderness: There is no abdominal tenderness.  Musculoskeletal:        General: Normal range of motion.  Skin:    General: Skin is warm.  Neurological:     Mental Status: She is alert and oriented to person, place, and time.  Psychiatric:        Mood and Affect: Mood normal.        Behavior: Behavior normal.      Lab Results  Component Value Date   WBC 10.3 02/24/2007   HGB 9.1 DELTA CHECK NOTED (L) 02/24/2007   HCT 26.0 (L) 02/24/2007   PLT 325 02/24/2007    Assessment & Plan:  Encounter for annual physical exam Assessment & Plan: CBC,CMP and TSH drawn today  Orders: -     TSH -     Comprehensive metabolic panel -     CBC with Differential/Platelet  Herpes simplex Assessment & Plan: Valtrex refilled No any flares this time  Orders: -     valACYclovir HCl; Take 1 tablet (1,000 mg total)  by mouth as needed.  Dispense: 90 tablet; Refill: 0  Burning with urination -     POCT URINALYSIS DIP (CLINITEK)  Gastroesophageal reflux disease without esophagitis  Morbid obesity (HCC)  Class 1 obesity due to excess calories without serious comorbidity with body mass index (BMI) of 33.0 to 33.9 in adult Assessment & Plan: Patient is intersted to use Zepbound  CBC, CMP, hemoglobin A1C and Lipid profile  Lifestyle modification discussed today.  Nutrition: Stressed importance of moderation in sodium intake, saturated fat and cholesterol, caloric balance, sufficient intake of complex carbohydrates, fiber, calcium and iron.   Exercise: Stressed the importance of regular exercise.     Orders: -     Lipid panel -     Hemoglobin A1c   This is a list of the screening recommended for you and due dates:  Health Maintenance  Topic Date Due   HIV Screening  Never done   Hepatitis C Screening: USPSTF Recommendation to screen - Ages 72-79 yo.  Never done   DTaP/Tdap/Td vaccine (1 - Tdap) Never done   Colon Cancer Screening  Never done   Pap Smear  07/16/2014   Mammogram  10/03/2016   Zoster (Shingles) Vaccine (2 of 2) 07/23/2021   Flu Shot  10/07/2021   COVID-19 Vaccine (3 - 2023-24 season) 11/07/2021   HPV Vaccine  Aged Out    Mammogram- August 2023, breast reduction  so need to do  every year Upto date with FLu shot , but refused COVID booster. One shingles vaccine gave her bad  side effect so not interested for other one. Hystrectomy - no need for PAP smear    Follow-up:  in 6 months for chronic check up and fasting labs I, Josip Merolla have reviewed all documentation for this visit. The documentation on 03/26/22   for the exam, diagnosis, procedures, and orders are all accurate and complete.     An After Visit Summary was printed and given to the patient.  Neil Crouch, DNP, [place] [phone]

## 2022-03-26 NOTE — Assessment & Plan Note (Signed)
CBC,CMP and TSH drawn today

## 2022-03-26 NOTE — Assessment & Plan Note (Signed)
Valtrex refilled No any flares this time

## 2022-03-27 LAB — CBC WITH DIFFERENTIAL/PLATELET
Basophils Absolute: 0.1 10*3/uL (ref 0.0–0.2)
Basos: 1 %
EOS (ABSOLUTE): 0.1 10*3/uL (ref 0.0–0.4)
Eos: 1 %
Hematocrit: 35.6 % (ref 34.0–46.6)
Hemoglobin: 12.1 g/dL (ref 11.1–15.9)
Immature Grans (Abs): 0 10*3/uL (ref 0.0–0.1)
Immature Granulocytes: 0 %
Lymphocytes Absolute: 2 10*3/uL (ref 0.7–3.1)
Lymphs: 39 %
MCH: 29.7 pg (ref 26.6–33.0)
MCHC: 34 g/dL (ref 31.5–35.7)
MCV: 88 fL (ref 79–97)
Monocytes Absolute: 0.4 10*3/uL (ref 0.1–0.9)
Monocytes: 8 %
Neutrophils Absolute: 2.7 10*3/uL (ref 1.4–7.0)
Neutrophils: 51 %
Platelets: 338 10*3/uL (ref 150–450)
RBC: 4.07 x10E6/uL (ref 3.77–5.28)
RDW: 12.3 % (ref 11.7–15.4)
WBC: 5.2 10*3/uL (ref 3.4–10.8)

## 2022-03-27 LAB — COMPREHENSIVE METABOLIC PANEL
ALT: 40 IU/L — ABNORMAL HIGH (ref 0–32)
AST: 32 IU/L (ref 0–40)
Albumin/Globulin Ratio: 1.4 (ref 1.2–2.2)
Albumin: 4.4 g/dL (ref 3.8–4.9)
Alkaline Phosphatase: 99 IU/L (ref 44–121)
BUN/Creatinine Ratio: 21 (ref 9–23)
BUN: 14 mg/dL (ref 6–24)
Bilirubin Total: 0.3 mg/dL (ref 0.0–1.2)
CO2: 22 mmol/L (ref 20–29)
Calcium: 9.3 mg/dL (ref 8.7–10.2)
Chloride: 106 mmol/L (ref 96–106)
Creatinine, Ser: 0.68 mg/dL (ref 0.57–1.00)
Globulin, Total: 3.2 g/dL (ref 1.5–4.5)
Glucose: 89 mg/dL (ref 70–99)
Potassium: 4.3 mmol/L (ref 3.5–5.2)
Sodium: 144 mmol/L (ref 134–144)
Total Protein: 7.6 g/dL (ref 6.0–8.5)
eGFR: 101 mL/min/{1.73_m2} (ref >59)

## 2022-03-27 LAB — HEMOGLOBIN A1C
Est. average glucose Bld gHb Est-mCnc: 120 mg/dL
Hgb A1c MFr Bld: 5.8 % — ABNORMAL HIGH (ref 4.8–5.6)

## 2022-03-27 LAB — LIPID PANEL
Chol/HDL Ratio: 3.8 ratio (ref 0.0–4.4)
Cholesterol, Total: 184 mg/dL (ref 100–199)
HDL: 49 mg/dL (ref >39)
LDL Chol Calc (NIH): 121 mg/dL — ABNORMAL HIGH (ref 0–99)
Triglycerides: 74 mg/dL (ref 0–149)
VLDL Cholesterol Cal: 14 mg/dL (ref 5–40)

## 2022-03-27 LAB — TSH: TSH: 1.53 u[IU]/mL (ref 0.450–4.500)

## 2022-03-27 LAB — CARDIOVASCULAR RISK ASSESSMENT

## 2022-03-29 ENCOUNTER — Encounter: Payer: Self-pay | Admitting: Nurse Practitioner

## 2022-03-30 ENCOUNTER — Other Ambulatory Visit: Payer: Self-pay | Admitting: Nurse Practitioner

## 2022-03-30 ENCOUNTER — Encounter: Payer: Self-pay | Admitting: Nurse Practitioner

## 2022-03-30 ENCOUNTER — Ambulatory Visit: Payer: BC Managed Care – PPO | Admitting: Podiatry

## 2022-03-30 DIAGNOSIS — E6609 Other obesity due to excess calories: Secondary | ICD-10-CM

## 2022-03-30 DIAGNOSIS — L600 Ingrowing nail: Secondary | ICD-10-CM

## 2022-03-30 DIAGNOSIS — M722 Plantar fascial fibromatosis: Secondary | ICD-10-CM | POA: Diagnosis not present

## 2022-03-30 DIAGNOSIS — L853 Xerosis cutis: Secondary | ICD-10-CM | POA: Diagnosis not present

## 2022-03-30 DIAGNOSIS — L84 Corns and callosities: Secondary | ICD-10-CM

## 2022-03-30 MED ORDER — UREA 10 % EX CREA: TOPICAL_CREAM | CUTANEOUS | 0 refills | Status: DC | PRN

## 2022-03-30 MED ORDER — ZEPBOUND 2.5 MG/0.5ML ~~LOC~~ SOAJ: 2.5000 mg | SUBCUTANEOUS | 0 refills | Status: DC

## 2022-03-30 MED ORDER — MELOXICAM 15 MG PO TABS: 15.0000 mg | ORAL_TABLET | Freq: Every day | ORAL | 0 refills | Status: DC

## 2022-03-30 NOTE — Addendum Note (Signed)
Addended by: Ames Coupe F on: 03/30/2022 01:57 PM   Modules accepted: Orders

## 2022-03-30 NOTE — Progress Notes (Signed)
Subjective:  Patient ID: [name], female    DOB: 03-23-1965,  MRN: [id number]  Chief Complaint  Patient presents with   Skin Problem    bilateral dry feet, heels are dry.Patient has tried soaking, Vaseline cream, moisturizer.   Ingrown Toenail    Possible ingrown to medial border on right foot. No pus or bleeding. Soreness to the border.    Callouses    Corn to 5th toe left foot.     57 y.o. female presents presents with multiple concerns related to both feet.  On the right foot she has pain at the lateral border of the right hallux nail.  This is been present for a long time has had a trimmed out previously but it comes back.  She also has concern for dry cracking skin on the heels of both feet.  She has tried soaking and Vaseline but not effective. Also concerned about a callus on the fifth toe of the left foot that has pain with palpation.  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  Incurvation is present along the lateral nail border of the right great toe. There is localized edema without any erythema or increase in warmth around the nail border. There is no drainage or pus. There is no ascending cellulitis. No malodor. No open lesions or pre-ulcerative lesions.  Attention directed to both heels there is noted to be dry cracked skin and hyperkeratotic tissue formation.  No ulceration is present   Vascular:  Dorsalis Pedis artery and Posterior Tibial artery pedal pulses are 2/4 bilateral.  Capillary fill time < 3 sec to all digits.   Neruologic: Grossly  intact via light touch bilateral. Protective threshold intact to all sites bilateral.   Musculoskeletal: Pain with palpation of the plantar medial tubercle of the left heel.  Negative pain with side-to-side squeeze.  Decreased ankle dorsiflexion range of motion  Gait: Unassisted, Nonantalgic.    Assessment:   1. Ingrown nail of great toe of right foot   2. Plantar fasciitis of left foot   3. Callus of foot   4. Xerosis of skin      Plan:  Patient was evaluated and treated and all questions answered.  Ingrown Nail, right -Patient elects to proceed with minor surgery to remove ingrown toenail today. Consent reviewed and signed by patient. -Ingrown nail excised. See procedure note. -Educated on post-procedure care including soaking. Written instructions provided and reviewed. -Patient to follow up in 2 weeks for nail check.  Procedure: Excision of Ingrown Toenail Location: Right 1st toe lateral nail borders. Anesthesia: Lidocaine 1% plain; 1.5 mL and Marcaine 0.5% plain; 1.5 mL, digital block. Skin Prep: Betadine. Dressing: Silvadene; telfa; dry, sterile, compression dressing. Technique: Following skin prep, the toe was exsanguinated and a tourniquet was secured at the base of the toe. The affected nail border was freed, split with a nail splitter, and excised. Chemical matrixectomy was then performed with phenol and irrigated out with alcohol. The tourniquet was then removed and sterile dressing applied. Disposition: Patient  tolerated procedure well. Patient to return in 2 weeks for follow-up.   Plantar Fasciitis, left - XR reviewed as above.  - Educated on icing and stretching. Instructions given.  - Injection delivered to the plantar fascia as below. - DME: Continue use of arch brace which patient has currently - Pharmacologic management: Meloxicam 15 mg daily for 30 days. Educated on risks/benefits and proper taking of medication.  Procedure: Injection  Tendon/Ligament Location: Left plantar fascia at the glabrous junction; medial approach. Skin Prep: alcohol Injectate: 1 cc 0.5% marcaine plain, 1 cc kenalog 10. Disposition: Patient tolerated procedure well. Injection site dressed with a band-aid.  Callus of skin left fifth toe as well as bilateral heels All symptomatic hyperkeratoses x 1 at the left fifth toe were safely debrided with a sterile #15 blade to patient's level of comfort without incident. We discussed preventative and palliative care of these lesions including supportive and accommodative shoegear, padding, prefabricated and custom molded accommodative orthoses, use of a pumice stone and lotions/creams daily. -E Rx for urea cream as well as soaks applied to the heels daily.   Return in about 2 weeks (around 04/13/2022) for Follow-up right hallux lateral border ingrown removal.          Everitt Amber, DPM [facility]

## 2022-03-30 NOTE — Patient Instructions (Signed)

## 2022-04-09 ENCOUNTER — Telehealth: Payer: Self-pay

## 2022-04-09 NOTE — Telephone Encounter (Signed)
[employer] denied the cover for Zepbound. Preference medicine is [facility], [id number].

## 2022-04-13 ENCOUNTER — Ambulatory Visit: Payer: BC Managed Care – PPO | Admitting: Podiatry

## 2022-04-19 ENCOUNTER — Other Ambulatory Visit: Payer: Self-pay | Admitting: Nurse Practitioner

## 2022-05-15 ENCOUNTER — Encounter: Payer: Self-pay | Admitting: Nurse Practitioner

## 2022-05-21 ENCOUNTER — Other Ambulatory Visit: Payer: Self-pay | Admitting: Nurse Practitioner

## 2022-05-21 ENCOUNTER — Encounter: Payer: Self-pay | Admitting: Podiatry

## 2022-05-21 DIAGNOSIS — R7989 Other specified abnormal findings of blood chemistry: Secondary | ICD-10-CM

## 2022-05-22 ENCOUNTER — Other Ambulatory Visit: Payer: Self-pay

## 2022-05-22 DIAGNOSIS — R7989 Other specified abnormal findings of blood chemistry: Secondary | ICD-10-CM

## 2022-05-23 LAB — HCG, SERUM, QUALITATIVE: hCG,Beta Subunit,Qual,Serum: POSITIVE m[IU]/mL — AB (ref <6)

## 2022-07-04 ENCOUNTER — Encounter: Payer: Self-pay | Admitting: Podiatry

## 2022-08-04 ENCOUNTER — Telehealth: Payer: Self-pay | Admitting: *Deleted

## 2022-08-04 DIAGNOSIS — M722 Plantar fascial fibromatosis: Secondary | ICD-10-CM

## 2022-08-04 NOTE — Telephone Encounter (Signed)
Patient is requesting a referral to [facility], had a partial  ingrown procedure in Jan., still has not healed properly, does not have insurance at the present but they will see her there.

## 2022-08-06 NOTE — Telephone Encounter (Signed)
Patient called back to give phone #  and fax of where she is wanting the referral sent, has been faxed.

## 2023-04-22 ENCOUNTER — Other Ambulatory Visit: Payer: Self-pay | Admitting: Nurse Practitioner

## 2023-04-22 DIAGNOSIS — B009 Herpesviral infection, unspecified: Secondary | ICD-10-CM

## 2023-04-22 NOTE — Telephone Encounter (Signed)
Copied from CRM [id number]. Topic: Clinical - Medication Refill >> Apr 22, 2023  2:48 PM Gery Pray wrote: Most Recent Primary Care Visit:  Provider: [facility]  Department: [facility]  Visit Type: LAB  Date: 05/22/2022  Medication: valACYclovir (VALTREX) 1000 MG tablet  Has the patient contacted their pharmacy? Yes (Agent: If no, request that the patient contact the pharmacy for the refill. If patient does not wish to contact the pharmacy document the reason why and proceed with request.) (Agent: If yes, when and what did the pharmacy advise?)  Is this the correct pharmacy for this prescription? Yes If no, delete pharmacy and type the correct one.  This is the patient's preferred pharmacy:  [address] [facility] [phone] [facility], [place] [address] [place] [address] Phone: [phone] Fax: [phone]   Has the prescription been filled recently? No  Is the patient out of the medication? Yes  Has the patient been seen for an appointment in the last year OR does the patient have an upcoming appointment? No  Can we respond through MyChart? Yes  Agent: Please be advised that Rx refills may take up to 3 business days. We ask that you follow-up with your pharmacy.

## 2023-06-25 NOTE — Progress Notes (Signed)
 [facility] [phone]   Lower Extremity Initial Evaluation  06/25/2023   Ref. Provider: Kayla Artist Setters, *  Rx: custom foot orthoses   HPI  SUBJECTIVE:  [name] is a 58 y.o. female who presents for evaluation of custom foot orthoses. She came to the clinic ambulating independently. Hx of R turf toe and sesamoiditis. Also notes hx of hip issues, leg length discrepancy, and plantar fasciitis (L) thus needing inserts for both sides. Currently in PT. Planning to try taping on R GT to limit extension. Has used some orthotic for the R side in the past but it didn't fit well in shoe. Pain in R foot increases with activity. Has tried OTS inserts.   Treatment History  Current/prior treatment/therapy: PT History of O&P Care: Reports hx of orthotic use on R but issues with fit OTS inserts  Physical Exam   Mobility Aids: none  Anatomical: Foot and ankle:  Right: reduced medial longitudinal arch             Left: normal medial longitudinal arch Knee:   Right: no significant deformity    Left: no significant deformity Significant changes with with weightbearing vs non-weightbearing: reduced MLA  LLD:  Side:  Amount:  as measured via   Skin condition: warm, dry, intact Sensation: reports sensation to light finger touch along plantar aspect Pain: discomfort with extension R 1st MTP jt  Range of Motion: WNL  MMT: WNL  Joint Laxity:   Palpation/Swelling/Edema: Nontender to palpation  Gait: heel-toe gait, reduced MLA, short stride length and minimal push-off Able to raise forefoot Difficulty with heel raise  Balance/Coordination: fair  Current Footwear, size and condition: Topo, 8, good   Assessment  Assessment: Amaranta was evaluated for custom foot orthoses. Pmhx of R turf toe and sesamoiditis. Izadora has used OTS inserts in the past with minimal pain improvement. Due to prior hx of L hip issues and LLD, patient would  like to proceed with bilateral inserts which is agreeable. Given concerns with fitting well in shoes, plan for 3/4 length. Reviewed design of inserts with patient. Bilateral custom foot orthoses are recommended to assist with supporting arches of the foot, offloading R 1st MTP joint, and redistributing plantar pressures. B foot foam impressions taken without incident.   Goals:  Short Term: Wear device full time during walking hours Distribute pressure evenly on the feet Support the hind foot towards neutral Support the medial longitudinal arch Reduce pain in the feet and ankles Long Term: Support the structure of the foot/ankle Increase pain free walking distance Improve balance (reduce falls) Reduce daily pain Prevent skin irritation or discomfort Patient Goals: Increase standing and walking distance Maintain independence with ADL's Participate in athletic activities/hobbies    Actions taken today   Assessment and Shape Capture: B foot foam impressions    Brace Design: Custom Foot Orthotics  Brace material: polypropylene and Neoprene  Need for Custom: [x]  Yes []  No   Oneil all that apply     Could not be fit with a pre-fabricated inserts [x]  Yes []  No   Has used prefabricated inserts unsuccessfully in the past [x]  Yes []  No   Condition expected to be long-standing in nature [x]  Yes []  No   There is a need to control the knee, ankle or foot in more than one plane [x]  Yes []  No   Custom design is needed to prevent tissue injury []  Yes [x]  No   Patient has a  healing fracture []  Yes [x]  No    Modifications expected to custom or prefabricated device  Size  []    Alignment []    Other []     Any additions or modifications to the standard: 3/4 length, R 1st met offload, intrinsic heel  Education   Counseled patient about brace recommendations, timelines, prescription, authorization process and follow up.  Patient expressed understanding and they agreed with the plan.  [x]  New  Patient Info provided  []  Medicare supplier standards provided  Plan   Return Visit:  will call when devices arrive   Authorization expectations:  will check   O&P Fabrication: CUSTOM ORDERS ARE WAITING ON AUTHORIZATION prior to ordering   O&P Supplies: No

## 2023-10-04 ENCOUNTER — Other Ambulatory Visit: Payer: Self-pay | Admitting: Obstetrics and Gynecology

## 2023-10-04 DIAGNOSIS — Z1231 Encounter for screening mammogram for malignant neoplasm of breast: Secondary | ICD-10-CM

## 2023-10-27 ENCOUNTER — Inpatient Hospital Stay: Payer: Self-pay | Attending: Hematology and Oncology | Admitting: *Deleted

## 2023-10-27 ENCOUNTER — Encounter (HOSPITAL_BASED_OUTPATIENT_CLINIC_OR_DEPARTMENT_OTHER): Payer: Self-pay | Admitting: Radiology

## 2023-10-27 ENCOUNTER — Ambulatory Visit (INDEPENDENT_AMBULATORY_CARE_PROVIDER_SITE_OTHER)
Admission: RE | Admit: 2023-10-27 | Discharge: 2023-10-27 | Disposition: A | Discharge status: Home / Self Care | Payer: Self-pay | Source: Ambulatory Visit | Attending: Obstetrics and Gynecology | Admitting: Obstetrics and Gynecology

## 2023-10-27 ENCOUNTER — Other Ambulatory Visit: Payer: Self-pay

## 2023-10-27 VITALS — BP 124/70 | Wt 191.0 lb

## 2023-10-27 DIAGNOSIS — Z1231 Encounter for screening mammogram for malignant neoplasm of breast: Secondary | ICD-10-CM

## 2023-10-27 DIAGNOSIS — Z1211 Encounter for screening for malignant neoplasm of colon: Secondary | ICD-10-CM

## 2023-10-27 DIAGNOSIS — Z1239 Encounter for other screening for malignant neoplasm of breast: Secondary | ICD-10-CM

## 2023-10-27 NOTE — Progress Notes (Signed)
 Ms. [name] is a 58 y.o. female who presents to [facility] clinic today with no complaints.    Pap Smear: Pap smear not completed today. Last Pap smear was 08/11/2021 at [facility] and was normal. Per patient has history of an abnormal Pap smear in 1998 that cryotherapy was completed for follow up. Patient stated that all Pap smears have been normal since and that she has had more than three normal Pap smears. Patient has a history of a Supracervical hysterectomy for AUB, endometriosis, and pain in 2008. Last Pap smear result is available in Epic.   Physical exam: Breasts Breasts symmetrical. No skin abnormalities bilateral breasts. No nipple retraction bilateral breasts. No nipple discharge bilateral breasts. No lymphadenopathy. No lumps palpated bilateral breasts. No complaints of pain or tenderness on exam.      Pelvic/Bimanual Pap is not indicated today per BCCCP guidelines.   Smoking History: Patient has never smoked.   Patient Navigation: Patient education provided. Access to services provided for patient through BCCCP program.   Colorectal Cancer Screening: Per patient had a colonoscopy at age 16 and a virtual colonoscopy 10 years ago. FIT Test given to patient to complete. No complaints today.    Breast and Cervical Cancer Risk Assessment: Patient has family history of mother having breast cancer. Patient has no known genetic mutations or history of radiation treatment to the chest before age 24. Per patient has history of cervical dysplasia. Patient has no history of being immunocompromised or DES exposure in-utero.  Risk Scores as of Encounter on 10/27/2023     [name]           5-year 2.9%   Lifetime 14.28%            Last calculated by [name], [name], CMA on 10/27/2023 at 11:26 AM        A: BCCCP exam without pap smear No complaints.  P: Referred patient to the [facility] for a screening mammogram. Appointment scheduled  Wednesday, October 27, 2023 at 1140.  Ashley Wanda SQUIBB, RN 10/27/2023 11:26 AM

## 2023-10-27 NOTE — Patient Instructions (Signed)
 Explained breast self awareness with [name]. Patient did not need a Pap smear today due to last Pap smear was 08/11/2021. Let her know BCCCP will cover Pap smears every 3 years unless has a history of abnormal Pap smears. Referred patient to the [name] for a screening mammogram. Appointment scheduled Wednesday, October 27, 2023 at 1140. Patient aware of appointment and will be there. Let patient know [facility] will follow up with her within the next couple weeks with results of her mammogram by letter or phone. [name] verbalized understanding.  [name], Wanda Ship, RN 11:26 AM

## 2023-11-05 ENCOUNTER — Ambulatory Visit: Payer: Self-pay

## 2023-11-05 LAB — FECAL OCCULT BLOOD, IMMUNOCHEMICAL: Fecal Occult Bld: NEGATIVE
